# Patient Record
Sex: Female | Born: 1990 | Race: White | Hispanic: No | Marital: Single
Health system: Southern US, Community
[De-identification: ages and names within clinical notes are randomized; demographics above are authoritative.]

## PROBLEM LIST (undated history)

## (undated) DIAGNOSIS — F419 Anxiety disorder, unspecified: Secondary | ICD-10-CM

## (undated) HISTORY — PX: ADENOIDECTOMY: SUR15

---

## 2021-08-03 ENCOUNTER — Ambulatory Visit (HOSPITAL_COMMUNITY)
Admission: EM | Admit: 2021-08-03 | Discharge: 2021-08-03 | Disposition: A | Discharge status: Home / Self Care | Payer: Self-pay | Attending: Student | Admitting: Student

## 2021-08-03 ENCOUNTER — Encounter (HOSPITAL_COMMUNITY): Payer: Self-pay | Admitting: Emergency Medicine

## 2021-08-03 ENCOUNTER — Other Ambulatory Visit: Payer: Self-pay

## 2021-08-03 DIAGNOSIS — Z0189 Encounter for other specified special examinations: Secondary | ICD-10-CM | POA: Insufficient documentation

## 2021-08-03 DIAGNOSIS — F411 Generalized anxiety disorder: Secondary | ICD-10-CM | POA: Insufficient documentation

## 2021-08-03 DIAGNOSIS — Z8616 Personal history of COVID-19: Secondary | ICD-10-CM | POA: Insufficient documentation

## 2021-08-03 DIAGNOSIS — K219 Gastro-esophageal reflux disease without esophagitis: Secondary | ICD-10-CM | POA: Insufficient documentation

## 2021-08-03 DIAGNOSIS — L608 Other nail disorders: Secondary | ICD-10-CM | POA: Insufficient documentation

## 2021-08-03 DIAGNOSIS — R002 Palpitations: Secondary | ICD-10-CM | POA: Insufficient documentation

## 2021-08-03 HISTORY — DX: Anxiety disorder, unspecified: F41.9

## 2021-08-03 LAB — CBC
HCT: 39.9 % (ref 36.0–46.0)
Hemoglobin: 13.1 g/dL (ref 12.0–15.0)
MCH: 28.8 pg (ref 26.0–34.0)
MCHC: 32.8 g/dL (ref 30.0–36.0)
MCV: 87.7 fL (ref 80.0–100.0)
Platelets: 236 10*3/uL (ref 150–400)
RBC: 4.55 MIL/uL (ref 3.87–5.11)
RDW: 13.2 % (ref 11.5–15.5)
WBC: 7 10*3/uL (ref 4.0–10.5)
nRBC: 0 % (ref 0.0–0.2)

## 2021-08-03 LAB — BASIC METABOLIC PANEL
Anion gap: 9 (ref 5–15)
BUN: 10 mg/dL (ref 6–20)
CO2: 25 mmol/L (ref 22–32)
Calcium: 9.2 mg/dL (ref 8.9–10.3)
Chloride: 102 mmol/L (ref 98–111)
Creatinine, Ser: 0.84 mg/dL (ref 0.44–1.00)
GFR, Estimated: >60 mL/min (ref >60)
Glucose, Bld: 97 mg/dL (ref 70–99)
Potassium: 4.6 mmol/L (ref 3.5–5.1)
Sodium: 136 mmol/L (ref 135–145)

## 2021-08-03 NOTE — ED Triage Notes (Signed)
Pt reports that she had heart racing/palpitations that have been intermittent over the past 2-3 days. Hx anxiety, not on medications for it but doesn't feel related to anxiety.

## 2021-08-03 NOTE — Discharge Instructions (Addendum)
-  Your EKG today looks good.  This is a good sign, but does not rule out cardiac pathology as it is only a brief picture in time. -Please contact your primary care provider and ask for a referral to a cardiologist for further monitoring, and if symptoms get worse please go to the emergency department.  This includes new chest pain, shortness of breath, dizziness, left arm pain, or palpitations occur and do not stop. -We will call if your lab work is abnormal.

## 2021-08-03 NOTE — ED Provider Notes (Signed)
[facility]    CSN: [id number] Arrival date & time: 08/03/21  1405      History   Chief Complaint Chief Complaint  Patient presents with   Palpitations    HPI Theresa Logan is a 30 y.o. female presenting with palpitations.  Medical history anxiety, GERD.  Patient states that she has a history of anxiety which causes palpitations intermittently, but these have been significantly worse over the last 3 days.  Describes them as fluttering in her chest that lasts from 3 to 20 minutes and terminates on its own.  Has not identified triggers.  This is not associated with dizziness, chest pain, headaches, left arm pain, nausea.  She does note chronic GERD as well, without current exacerbation.  States she sometimes gets central chest burning following eating, but this has not been worse than normal lately.  Denies excessive NSAIDs, alcohol.  States she thinks her anxiety is making the palpitations worse, but this feels different than normal.  Does not take medication for anxiety.  HPI  Past Medical History:  Diagnosis Date   Anxiety     There are no problems to display for this patient.   Past Surgical History:  Procedure Laterality Date   ADENOIDECTOMY Bilateral     OB History   No obstetric history on file.      Home Medications    Prior to Admission medications   Not on File    Family History No family history on file.  Social History     Allergies   Patient has no known allergies.   Review of Systems Review of Systems  Respiratory:  Negative for shortness of breath.   Cardiovascular:  Positive for palpitations. Negative for chest pain.  All other systems reviewed and are negative.   Physical Exam Triage Vital Signs ED Triage Vitals  Enc Vitals Group     BP 08/03/21 1434 124/79     Pulse Rate 08/03/21 1434 (!) 101     Resp 08/03/21 1434 16     Temp 08/03/21 1434 98.1 F (36.7 C)     Temp Source 08/03/21 1434 Oral     SpO2 08/03/21 1434  100 %     Weight --      Height --      Head Circumference --      Peak Flow --      Pain Score 08/03/21 1431 0     Pain Loc --      Pain Edu? --      Excl. in GC? --    No data found.  Updated Vital Signs BP 124/79 (BP Location: Left Arm)   Pulse (!) 101   Temp 98.1 F (36.7 C) (Oral)   Resp 16   LMP 07/23/2021   SpO2 100%   Visual Acuity Right Eye Distance:   Left Eye Distance:   Bilateral Distance:    Right Eye Near:   Left Eye Near:    Bilateral Near:     Physical Exam Vitals reviewed.  Constitutional:      Appearance: Normal appearance. She is not diaphoretic.  HENT:     Head: Normocephalic and atraumatic.     Mouth/Throat:     Mouth: Mucous membranes are moist.  Eyes:     Extraocular Movements: Extraocular movements intact.     Pupils: Pupils are equal, round, and reactive to light.  Cardiovascular:     Rate and Rhythm: Normal rate and regular rhythm.  Pulses:          Radial pulses are 2+ on the right side and 2+ on the left side.     Heart sounds: Normal heart sounds.  Pulmonary:     Effort: Pulmonary effort is normal.     Breath sounds: Normal breath sounds.  Abdominal:     Palpations: Abdomen is soft.     Tenderness: There is no abdominal tenderness. There is no guarding or rebound.  Musculoskeletal:     Right lower leg: No edema.     Left lower leg: No edema.     Comments: Calves are equal and symmetric without tenderness or swelling  Skin:    General: Skin is warm.     Capillary Refill: Capillary refill takes less than 2 seconds.  Neurological:     General: No focal deficit present.     Mental Status: She is alert and oriented to person, place, and time.  Psychiatric:        Mood and Affect: Mood normal.        Behavior: Behavior normal.        Thought Content: Thought content normal.        Judgment: Judgment normal.     UC Treatments / Results  Labs (all labs ordered are listed, but only abnormal results are displayed) Labs  Reviewed  BASIC METABOLIC PANEL  CBC    EKG   Radiology No results found.  Procedures Procedures (including critical care time)  Medications Ordered in UC Medications - No data to display  Initial Impression / Assessment and Plan / UC Course  I have reviewed the triage vital signs and the nursing notes.  Pertinent labs & imaging results that were available during my care of the patient were reviewed by me and considered in my medical decision making (see chart for details).     This patient is a very pleasant 30 y.o. year old female presenting with palpitations and anxiety. Borderline tachy ranging 87-101. History anxiety and palpitations related to this, does not take medications for anxiety. She did have covid-19 03/2021.  Pain is not reproducible. EKG NSR, no prior EKG for comparison.  Trial of pepcid or TUMS for GERD. Patient did incidentally show me her R thumbnail which has several vertical grooves; suspect this is related to fake nails but will check a CBC, CMP.   Rec f/u with cardiology for cardiac monitoring. PCP can coordinate this. ED if symptoms worsen/persist.  Strict ED return precautions discussed. Patient verbalizes understanding and agreement.   Coding Level 4 for acute illness with uncertain prognosis with systemic symptoms (tachycardia), and drug management (pepcid).      Final Clinical Impressions(s) / UC Diagnoses   Final diagnoses:  Palpitations  History of COVID-19  Routine lab draw  Gastroesophageal reflux disease without esophagitis  Anxiety state  Change in nail appearance     Discharge Instructions      -Your EKG today looks good.  This is a good sign, but does not rule out cardiac pathology as it is only a brief picture in time. -Please contact your primary care provider and ask for a referral to a cardiologist for further monitoring, and if symptoms get worse please go to the emergency department.  This includes new chest pain,  shortness of breath, dizziness, left arm pain, or palpitations occur and do not stop. -We will call if your lab work is abnormal.     ED Prescriptions   None  PDMP not reviewed this encounter.   Rhys Martini, PA-C 08/03/21 1511

## 2021-08-05 ENCOUNTER — Encounter (HOSPITAL_COMMUNITY): Payer: Self-pay

## 2021-08-05 ENCOUNTER — Emergency Department (HOSPITAL_COMMUNITY): Payer: Medicaid - Out of State

## 2021-08-05 ENCOUNTER — Emergency Department (HOSPITAL_COMMUNITY)
Admission: EM | Admit: 2021-08-05 | Discharge: 2021-08-05 | Disposition: A | Discharge status: Home / Self Care | Payer: Medicaid - Out of State | Attending: Emergency Medicine | Admitting: Emergency Medicine

## 2021-08-05 DIAGNOSIS — R002 Palpitations: Secondary | ICD-10-CM | POA: Diagnosis present

## 2021-08-05 DIAGNOSIS — R079 Chest pain, unspecified: Secondary | ICD-10-CM | POA: Insufficient documentation

## 2021-08-05 DIAGNOSIS — R52 Pain, unspecified: Secondary | ICD-10-CM

## 2021-08-05 LAB — BASIC METABOLIC PANEL
Anion gap: 6 (ref 5–15)
BUN: 13 mg/dL (ref 6–20)
CO2: 24 mmol/L (ref 22–32)
Calcium: 9.3 mg/dL (ref 8.9–10.3)
Chloride: 107 mmol/L (ref 98–111)
Creatinine, Ser: 0.81 mg/dL (ref 0.44–1.00)
GFR, Estimated: >60 mL/min (ref >60)
Glucose, Bld: 105 mg/dL — ABNORMAL HIGH (ref 70–99)
Potassium: 4.2 mmol/L (ref 3.5–5.1)
Sodium: 137 mmol/L (ref 135–145)

## 2021-08-05 LAB — CBC
HCT: 44.3 % (ref 36.0–46.0)
Hemoglobin: 14.9 g/dL (ref 12.0–15.0)
MCH: 29.2 pg (ref 26.0–34.0)
MCHC: 33.6 g/dL (ref 30.0–36.0)
MCV: 86.9 fL (ref 80.0–100.0)
Platelets: 269 10*3/uL (ref 150–400)
RBC: 5.1 MIL/uL (ref 3.87–5.11)
RDW: 13.2 % (ref 11.5–15.5)
WBC: 8.8 10*3/uL (ref 4.0–10.5)
nRBC: 0 % (ref 0.0–0.2)

## 2021-08-05 LAB — TROPONIN I (HIGH SENSITIVITY): Troponin I (High Sensitivity): 2 ng/L (ref <18)

## 2021-08-05 LAB — TSH: TSH: 2.058 u[IU]/mL (ref 0.350–4.500)

## 2021-08-05 LAB — D-DIMER, QUANTITATIVE: D-Dimer, Quant: <0.27 ug/mL-FEU (ref 0.00–0.50)

## 2021-08-05 NOTE — ED Provider Notes (Signed)
Emergency Medicine Provider Triage Evaluation Note  Theresa Logan , a 30 y.o. female  was evaluated in triage.  Pt complains of palpitations, cough and intermittent chest pain. She further c/o some sob. 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  Review of Systems  Positive: Chest pain, sob, cough, palpitations Negative: fever  Physical Exam  BP 137/90 (BP Location: Right Arm)   Pulse 78   Temp 98.3 F (36.8 C) (Oral)   Resp 16   Ht 5\' 6"  (1.676 m)   Wt 72.6 kg   LMP 07/23/2021   SpO2 100%   BMI 25.82 kg/m  Gen:   Awake, no distress   Resp:  Normal effort  MSK:   Moves extremities without difficulty  Other:  Tachycardia, lungs ctab, no unilateral leg swelling or calf ttp  Medical Decision Making  Medically screening exam initiated at 12:45 PM.  Appropriate orders placed.  Theresa Logan was informed that the remainder of the evaluation will be completed by another provider, this initial triage assessment does not replace that evaluation, and the importance of remaining in the ED until their evaluation is complete.     Theresa Logan 08/05/21 1246    08/07/21, MD 08/05/21 [phone]

## 2021-08-05 NOTE — Discharge Instructions (Signed)
You have been evaluated for your heart palpitation.  Fortunately your labs are reassuring.  A TSH was obtained today and have not resulted yet.  Please check the result through MyChart, link below.  TSH is a blood lab marker to assess your thyroid function.  If abnormal, consider follow-up with your primary care provider for further management.  Return if you have any concern.

## 2021-08-05 NOTE — ED Notes (Signed)
An After Visit Summary was printed and given to the patient. Discharge instructions given and no further questions at this time.  

## 2021-08-05 NOTE — ED Notes (Signed)
Pt care taken, has a little anxiety at this time.

## 2021-08-05 NOTE — ED Provider Notes (Signed)
[facility]EMERGENCY [facility]PT Provider Note   CSN: [id number] Arrival date & time: 08/05/21  1144     History Chief Complaint  Patient presents with   Chest Pain    Theresa Logan is a 30 y.o. female.  The history is provided by the patient and medical records. No language interpreter was used.  Chest Pain  30 year old female significant history of anxiety who presents for evaluation of heart palpitation.  Patient report for the past 2 to 3 days she has had recurrent episodes of heart palpitation.  States that seems to be beating irregularly and she feels a bit lightheadedness during this episode.  Is happen sporadically without any provocation.  There is no associated fever headache cough shortness of breath chest pain back pain abdominal pain.  Denies nausea vomiting or diarrhea.  Does endorse increasing stress because she travels extensively for work.  No prior history of PE or DVT no leg swelling or calf pain.  She denies increasing using caffeine or energy drinks.  No history of thyroid disease.  She report no significant symptoms since she has been in the ED for the past 9 hours.  She was last seen at urgent care center several days ago for same.  Past Medical History:  Diagnosis Date   Anxiety     There are no problems to display for this patient.   Past Surgical History:  Procedure Laterality Date   ADENOIDECTOMY Bilateral      OB History   No obstetric history on file.     History reviewed. No pertinent family history.     Home Medications Prior to Admission medications   Not on File    Allergies    Patient has no known allergies.  Review of Systems   Review of Systems  Cardiovascular:  Positive for chest pain.  All other systems reviewed and are negative.  Physical Exam Updated Vital Signs BP 137/90 (BP Location: Right Arm)   Pulse 78   Temp 98.3 F (36.8 C) (Oral)   Resp 16   Ht 5\' 6"  (1.676 m)   Wt 72.6 kg   LMP 07/23/2021    SpO2 100%   BMI 25.82 kg/m   Physical Exam Vitals and nursing note reviewed.  Constitutional:      General: She is not in acute distress.    Appearance: She is well-developed.  HENT:     Head: Atraumatic.  Eyes:     Conjunctiva/sclera: Conjunctivae normal.  Neck:     Comments: No thyromegaly Cardiovascular:     Rate and Rhythm: Normal rate and regular rhythm.     Pulses: Normal pulses.     Heart sounds: Normal heart sounds.  Pulmonary:     Effort: Pulmonary effort is normal.     Breath sounds: No wheezing, rhonchi or rales.  Abdominal:     Palpations: Abdomen is soft.  Musculoskeletal:     Cervical back: Normal range of motion and neck supple. No rigidity or tenderness.     Right lower leg: No edema.     Left lower leg: No edema.  Skin:    Findings: No rash.  Neurological:     Mental Status: She is alert. Mental status is at baseline.  Psychiatric:        Mood and Affect: Mood normal.    ED Results / Procedures / Treatments   Labs (all labs ordered are listed, but only abnormal results are displayed) Labs Reviewed  BASIC METABOLIC PANEL -  Abnormal; Notable for the following components:      Result Value   Glucose, Bld 105 (*)    All other components within normal limits  CBC  D-DIMER, QUANTITATIVE  TSH  TROPONIN I (HIGH SENSITIVITY)  TROPONIN I (HIGH SENSITIVITY)    EKG EKG Interpretation  Date/Time:  Thursday August 05 2021 21:04:03 EDT Ventricular Rate:  84 PR Interval:  140 QRS Duration: 90 QT Interval:  379 QTC Calculation: 448 R Axis:   74 Text Interpretation: Sinus rhythm Since last tracing rate slower Otherwise no significant change Confirmed by Mancel Bale [phone]) on 08/05/2021 9:17:17 PM ED ECG REPORT   Date: 08/05/2021  Rate: 102  Rhythm: sinus tachycardia  QRS Axis: normal  Intervals: normal  ST/T Wave abnormalities: normal  Conduction Disutrbances:none  Narrative Interpretation:   Old EKG Reviewed: none available  I have  personally reviewed the EKG tracing and agree with the computerized printout as noted.   Radiology DG Chest 1 View  Result Date: 08/05/2021 CLINICAL DATA:  Chest pain EXAM: CHEST  1 VIEW COMPARISON:  None. FINDINGS: The heart size and mediastinal contours are within normal limits. Both lungs are clear. The visualized skeletal structures are unremarkable. IMPRESSION: No active disease. Reading location: [place], [place]. Electronically Signed   By: Ernie Avena M.D.   On: 08/05/2021 13:54    Procedures Procedures   Medications Ordered in ED Medications - No data to display  ED Course  I have reviewed the triage vital signs and the nursing notes.  Pertinent labs & imaging results that were available during my care of the patient were reviewed by me and considered in my medical decision making (see chart for details).    MDM Rules/Calculators/A&P                           BP 140/89 (BP Location: Left Arm)   Pulse 74   Temp 97.7 F (36.5 C) (Oral)   Resp 13   Ht 5\' 6"  (1.676 m)   Wt 72.6 kg   LMP 07/23/2021   SpO2 100%   BMI 25.82 kg/m   Final Clinical Impression(s) / ED Diagnoses Final diagnoses:  Heart palpitations    Rx / DC Orders ED Discharge Orders     None      9:20 PM Patient here with intermittent heart palpitation for the past several days.  Work-up today has been essentially unremarkable.  Due to her extensive travel, a D-dimer was obtained and fortunately was negative.  Chest x-ray unremarkable, EKG initially showed sinus tachycardia, repeat EKG is normal.  No significant electrolyte derangement.  Vital signs stable.  TSH has been ordered.  Patient can check of the result through MyChart.  Encourage patient to follow-up with PCP or with cardiology at her earliest convenience as she may benefit from cardiac Holter monitoring if indicated.  At this time patient is stable for discharge.   07/25/2021, PA-C 08/05/21 2209    2210,  MD 08/06/21 [phone]

## 2021-08-05 NOTE — ED Triage Notes (Addendum)
Pt states she has been having palpitations for several days. Pt was seen at UC 2 days ago, told to come in if it continued. Pt states cough and some SHOB, but recent sinus infection. Pt states this does not feel like anxiety. Pt had recent travel from [place].

## 2022-11-15 IMAGING — CR DG CHEST 1V
1 series · 1 of 1 positions shown · non-contrast
Comparison: None.

CLINICAL DATA: Chest pain

EXAM:
CHEST  1 VIEW

[w chest pa]
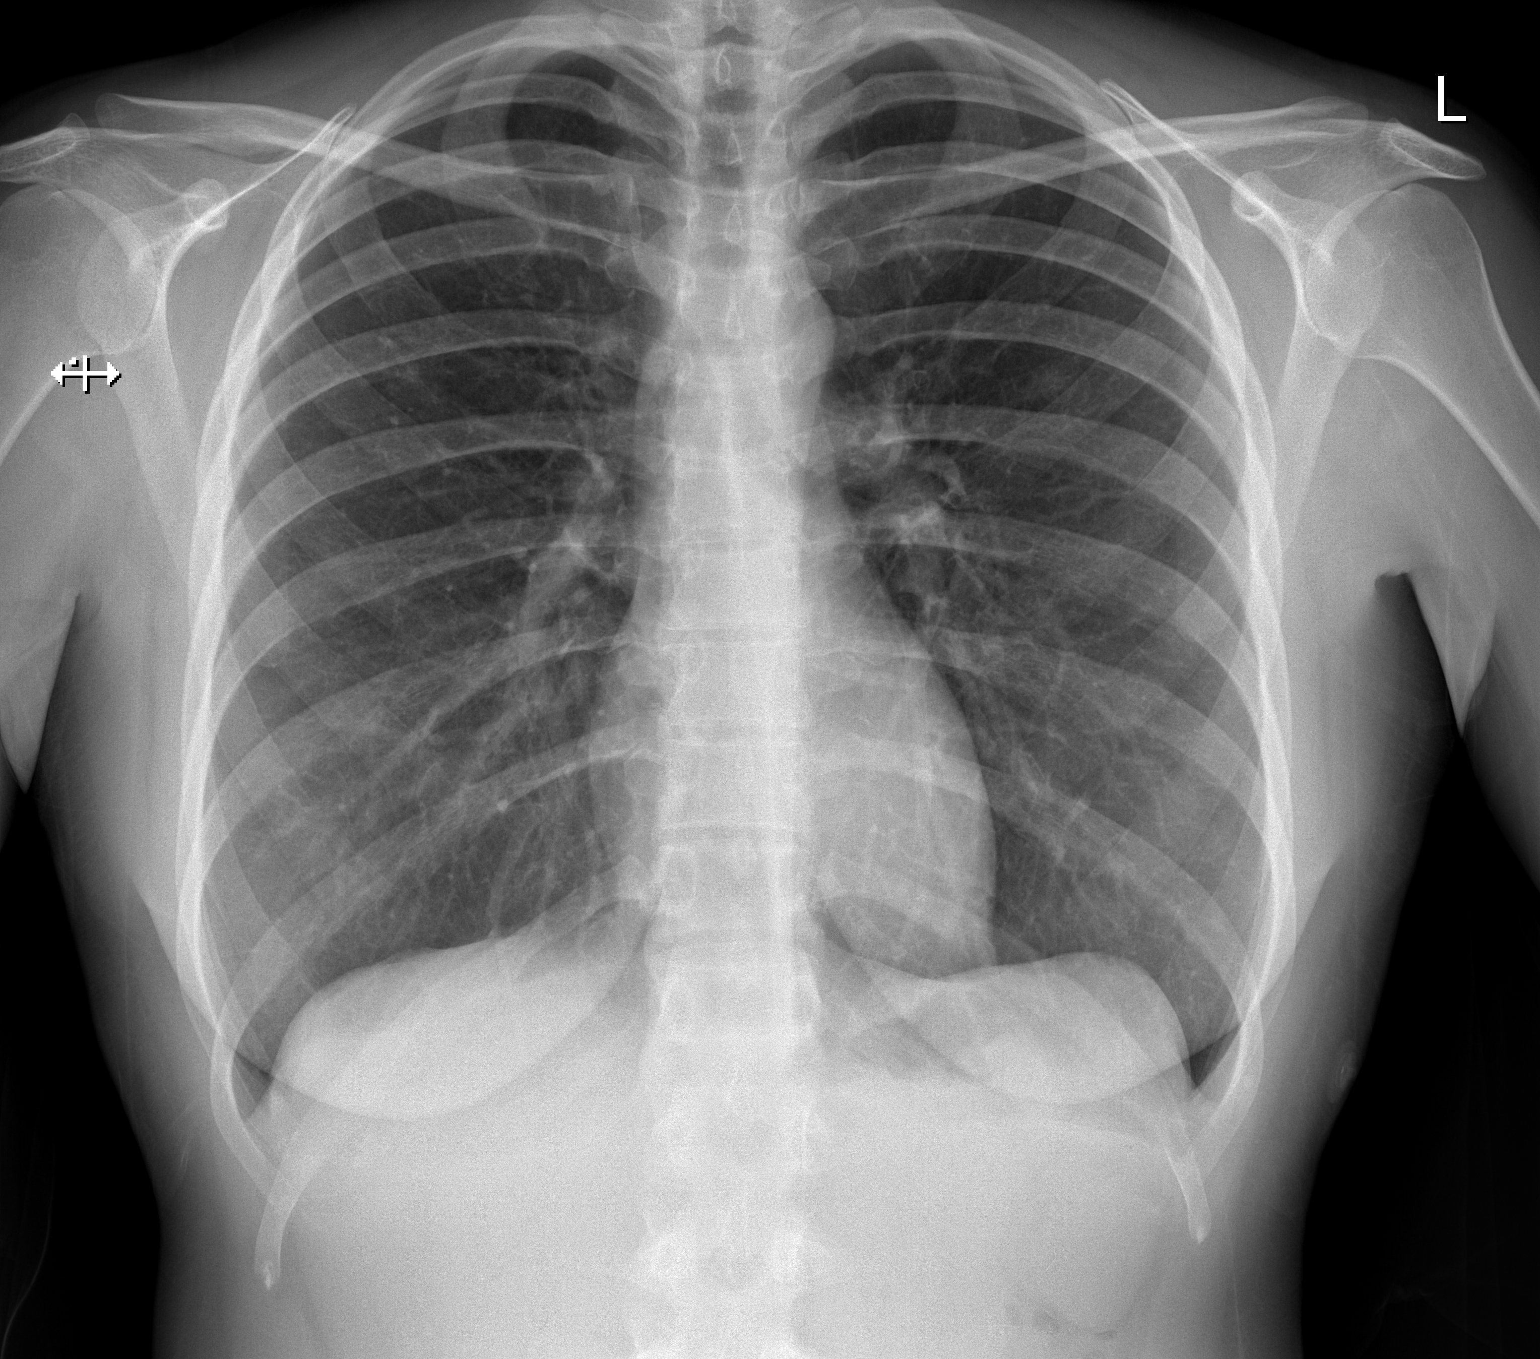

[1 of 1 positions shown; findings below may reference images not displayed]

FINDINGS: The heart size and mediastinal contours are within normal limits.
Both lungs are clear. The visualized skeletal structures are
unremarkable.
IMPRESSION: No active disease.

Reading location: [place], [place].
